# Patient Record
Sex: Male | Born: 1955 | Race: White | Hispanic: No | Smoking: Current some day smoker
Health system: Southern US, Community
[De-identification: ages and names within clinical notes are randomized; demographics above are authoritative.]

## PROBLEM LIST (undated history)

## (undated) DIAGNOSIS — K219 Gastro-esophageal reflux disease without esophagitis: Secondary | ICD-10-CM

## (undated) HISTORY — PX: ACHILLES TENDON REPAIR: SUR1153

---

## 2014-11-25 ENCOUNTER — Encounter: Payer: Self-pay | Admitting: Gastroenterology

## 2015-01-17 ENCOUNTER — Ambulatory Visit: Payer: Self-pay | Admitting: Gastroenterology

## 2015-01-18 ENCOUNTER — Encounter (HOSPITAL_COMMUNITY): Payer: Self-pay | Admitting: *Deleted

## 2015-01-18 ENCOUNTER — Emergency Department (HOSPITAL_COMMUNITY)
Admission: EM | Admit: 2015-01-18 | Discharge: 2015-01-18 | Disposition: A | Discharge status: Home / Self Care | Payer: BLUE CROSS/BLUE SHIELD | Attending: Emergency Medicine | Admitting: Emergency Medicine

## 2015-01-18 DIAGNOSIS — Z720 Tobacco use: Secondary | ICD-10-CM | POA: Diagnosis not present

## 2015-01-18 DIAGNOSIS — Z79899 Other long term (current) drug therapy: Secondary | ICD-10-CM | POA: Diagnosis not present

## 2015-01-18 DIAGNOSIS — K219 Gastro-esophageal reflux disease without esophagitis: Secondary | ICD-10-CM | POA: Diagnosis not present

## 2015-01-18 DIAGNOSIS — R112 Nausea with vomiting, unspecified: Secondary | ICD-10-CM | POA: Diagnosis not present

## 2015-01-18 DIAGNOSIS — R1011 Right upper quadrant pain: Secondary | ICD-10-CM | POA: Insufficient documentation

## 2015-01-18 HISTORY — DX: Gastro-esophageal reflux disease without esophagitis: K21.9

## 2015-01-18 LAB — URINALYSIS, ROUTINE W REFLEX MICROSCOPIC
BILIRUBIN URINE: NEGATIVE
Glucose, UA: NEGATIVE mg/dL
HGB URINE DIPSTICK: NEGATIVE
Ketones, ur: NEGATIVE mg/dL
Leukocytes, UA: NEGATIVE
Nitrite: NEGATIVE
PROTEIN: NEGATIVE mg/dL
SPECIFIC GRAVITY, URINE: 1.025 (ref 1.005–1.030)
UROBILINOGEN UA: 0.2 mg/dL (ref 0.0–1.0)
pH: 5.5 (ref 5.0–8.0)

## 2015-01-18 LAB — COMPREHENSIVE METABOLIC PANEL
ALBUMIN: 4.3 g/dL (ref 3.5–5.0)
ALK PHOS: 61 U/L (ref 38–126)
ALT: 31 U/L (ref 17–63)
AST: 27 U/L (ref 15–41)
Anion gap: 8 (ref 5–15)
BILIRUBIN TOTAL: 0.3 mg/dL (ref 0.3–1.2)
BUN: 21 mg/dL — AB (ref 6–20)
CALCIUM: 9.1 mg/dL (ref 8.9–10.3)
CO2: 27 mmol/L (ref 22–32)
CREATININE: 1.34 mg/dL — AB (ref 0.61–1.24)
Chloride: 102 mmol/L (ref 101–111)
GFR calc Af Amer: >60 mL/min (ref >60)
GFR calc non Af Amer: 57 mL/min — ABNORMAL LOW (ref >60)
GLUCOSE: 91 mg/dL (ref 65–99)
Potassium: 4 mmol/L (ref 3.5–5.1)
Sodium: 137 mmol/L (ref 135–145)
TOTAL PROTEIN: 7.4 g/dL (ref 6.5–8.1)

## 2015-01-18 LAB — CBC
HCT: 36.9 % — ABNORMAL LOW (ref 39.0–52.0)
Hemoglobin: 11.4 g/dL — ABNORMAL LOW (ref 13.0–17.0)
MCH: 27.1 pg (ref 26.0–34.0)
MCHC: 30.9 g/dL (ref 30.0–36.0)
MCV: 87.6 fL (ref 78.0–100.0)
Platelets: 241 10*3/uL (ref 150–400)
RBC: 4.21 MIL/uL — ABNORMAL LOW (ref 4.22–5.81)
RDW: 14 % (ref 11.5–15.5)
WBC: 6.3 10*3/uL (ref 4.0–10.5)

## 2015-01-18 LAB — LIPASE, BLOOD: Lipase: 30 U/L (ref 11–51)

## 2015-01-18 MED ORDER — OMEPRAZOLE 20 MG PO CPDR: 20.0000 mg | DELAYED_RELEASE_CAPSULE | Freq: Two times a day (BID) | ORAL | Status: AC

## 2015-01-18 MED ORDER — GI COCKTAIL ~~LOC~~
30.0000 mL | Freq: Once | ORAL | Status: AC
  Administered 2015-01-18: 30 mL via ORAL
  Filled 2015-01-18: qty 30

## 2015-01-18 NOTE — ED Notes (Signed)
Patient unable to void at this time

## 2015-01-18 NOTE — Discharge Instructions (Signed)

## 2015-01-18 NOTE — ED Notes (Addendum)
Patient reports right side abd pain that is sporadic in nature over the past year. Reports n/v at times, most recent vomiting was last week. Had a "severe" pain today while at work. Denies diarrhea. Patient reports after eating is when pain begins.

## 2015-01-18 NOTE — ED Provider Notes (Signed)
CSN: [id number]     Arrival date & time 01/18/15  1715 History   First MD Initiated Contact with Patient 01/18/15 1826     Chief Complaint  Patient presents with  . Abdominal Pain     (Consider location/radiation/quality/duration/timing/severity/associated sxs/prior Treatment) HPI  The patient is 59 years old, he has no prior surgical history, he does report a history of endoscopy in the past, notes that over the last year he has had intermittent right upper quadrant pain, seems to get worse after eating, this was particularly bad today but has since improved and is essentially resolved at this point. Occasionally he has nausea and vomiting, there is no diarrhea, no changes in stools, no rectal bleeding, no fevers or chills. Symptoms are intermittent, currently they are scant and almost completely resolved.  Past Medical History  Diagnosis Date  . Acid reflux    Past Surgical History  Procedure Laterality Date  . Achilles tendon repair     History reviewed. No pertinent family history. Social History  Substance Use Topics  . Smoking status: Current Some Day Smoker    Types: Cigarettes  . Smokeless tobacco: None  . Alcohol Use: No     Comment: occ    Review of Systems  All other systems reviewed and are negative.     Allergies  Aspirin  Home Medications   Prior to Admission medications   Medication Sig Start Date End Date Taking? Authorizing Provider  Cholecalciferol (VITAMIN D3) 10000 UNITS TABS Take 1,000 Units by mouth daily.   Yes Historical Provider, MD  vitamin B-12 (CYANOCOBALAMIN) 1000 MCG tablet Take 1,000 mcg by mouth daily.   Yes Historical Provider, MD  omeprazole (PRILOSEC) 20 MG capsule Take 1 capsule (20 mg total) by mouth 2 (two) times daily before a meal. 01/18/15   Eber Hong, MD   BP 114/64 mmHg  Pulse 84  Temp(Src) 98.1 F (36.7 C) (Oral)  Resp 18  Ht 6' (1.829 m)  Wt 200 lb (90.719 kg)  BMI 27.12 kg/m2  SpO2 100% Physical Exam   Constitutional: He appears well-developed and well-nourished. No distress.  HENT:  Head: Normocephalic and atraumatic.  Mouth/Throat: Oropharynx is clear and moist. No oropharyngeal exudate.  Eyes: Conjunctivae and EOM are normal. Pupils are equal, round, and reactive to light. Right eye exhibits no discharge. Left eye exhibits no discharge. No scleral icterus.  Neck: Normal range of motion. Neck supple. No JVD present. No thyromegaly present.  Cardiovascular: Normal rate, regular rhythm, normal heart sounds and intact distal pulses.  Exam reveals no gallop and no friction rub.   No murmur heard. Pulmonary/Chest: Effort normal and breath sounds normal. No respiratory distress. He has no wheezes. He has no rales.  Abdominal: Soft. Bowel sounds are normal. He exhibits no distension and no mass. There is tenderness ( scant ttp in the RUQ - no guarding.).  Musculoskeletal: Normal range of motion. He exhibits no edema or tenderness.  Lymphadenopathy:    He has no cervical adenopathy.  Neurological: He is alert. Coordination normal.  Skin: Skin is warm and dry. No rash noted. No erythema.  Psychiatric: He has a normal mood and affect. His behavior is normal.  Nursing note and vitals reviewed.   ED Course  Procedures (including critical care time) Labs Review Labs Reviewed  COMPREHENSIVE METABOLIC PANEL - Abnormal; Notable for the following:    BUN 21 (*)    Creatinine, Ser 1.34 (*)    GFR calc non Af Amer 57 (*)  All other components within normal limits  CBC - Abnormal; Notable for the following:    RBC 4.21 (*)    Hemoglobin 11.4 (*)    HCT 36.9 (*)    All other components within normal limits  LIPASE, BLOOD  URINALYSIS, ROUTINE W REFLEX MICROSCOPIC ([facility])    Imaging Review No results found. I have personally reviewed and evaluated these images and lab results as part of my medical decision-making.    MDM   Final diagnoses:  Right upper quadrant pain     Possible GB disease vs PUD vs gastritis vs pancreatitis - No jaundice, VS normal - essentially resolved pain by arrival - check labs - anticipate US in the morning if there is no other findings to suggest need for emergent CT scan.  No pain at mcB point to suggest appy.  Improved with meds - labs reassuring - pt updated - given gen surg referral and will come back in AM for US - prilosec.  Eber HongBrian Clenton Esper, MD 01/18/15 2045

## 2015-01-19 ENCOUNTER — Ambulatory Visit (HOSPITAL_COMMUNITY)
Admission: RE | Admit: 2015-01-19 | Discharge: 2015-01-19 | Disposition: A | Discharge status: Home / Self Care | Payer: BLUE CROSS/BLUE SHIELD | Source: Ambulatory Visit | Attending: Emergency Medicine | Admitting: Emergency Medicine

## 2015-01-19 ENCOUNTER — Other Ambulatory Visit (HOSPITAL_COMMUNITY): Payer: Self-pay | Admitting: Emergency Medicine

## 2015-01-19 DIAGNOSIS — R1084 Generalized abdominal pain: Secondary | ICD-10-CM

## 2015-01-19 DIAGNOSIS — R109 Unspecified abdominal pain: Secondary | ICD-10-CM | POA: Diagnosis not present

## 2015-03-09 ENCOUNTER — Ambulatory Visit: Payer: Self-pay | Admitting: Gastroenterology

## 2016-12-17 IMAGING — US US ABDOMEN LIMITED
1 series · 14 of 25 positions shown · non-contrast
Comparison: None.

CLINICAL DATA: Postprandial abdominal pain over the last year,
worse today

EXAM:
US ABDOMEN LIMITED - RIGHT UPPER QUADRANT

[Series 1: us abdomen limited · 0.21mm/px · 14 of 54 slices shown]
[im 1/54]
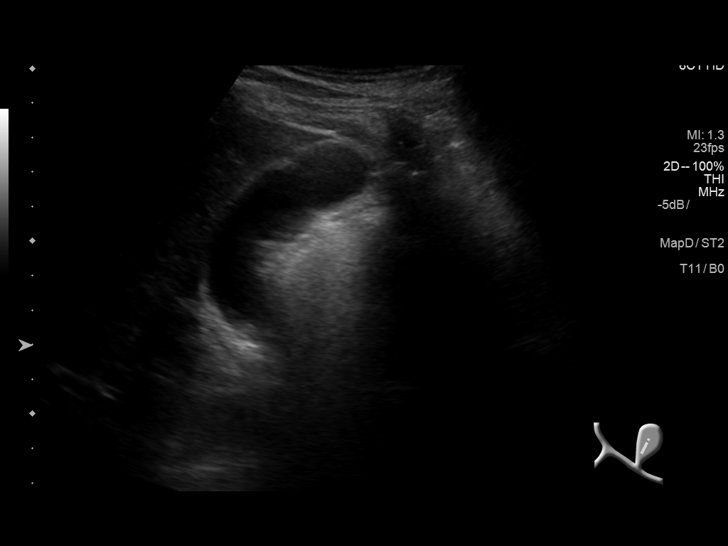
[im 5/54]
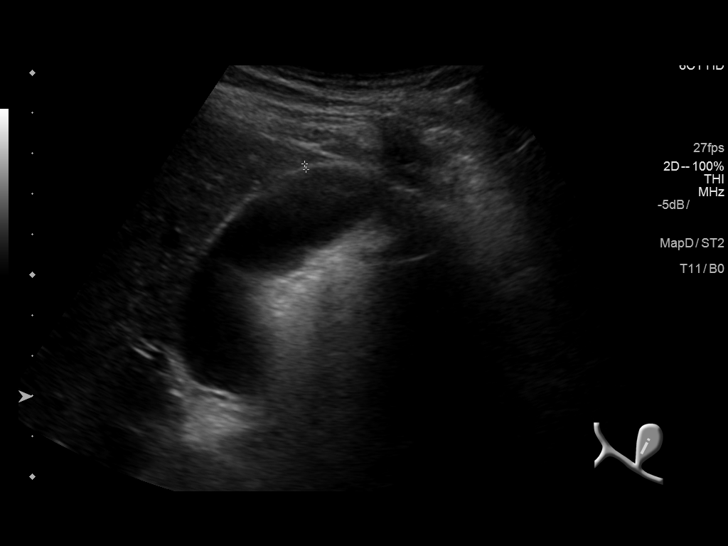
[im 9/54]
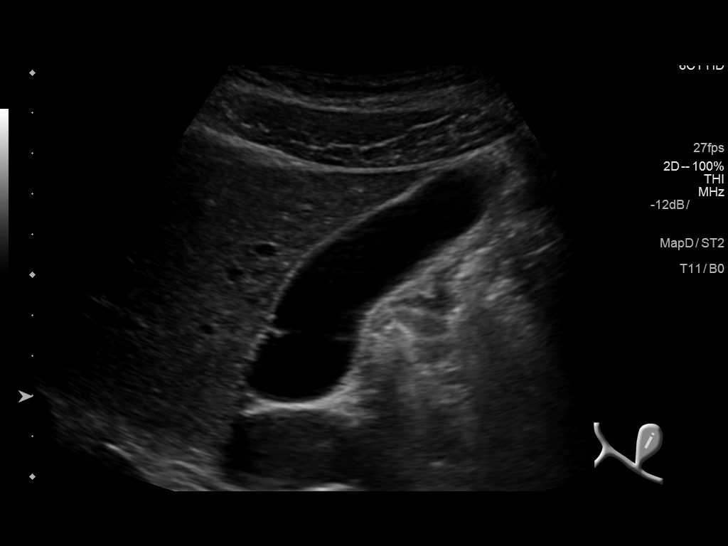
[im 14/54]
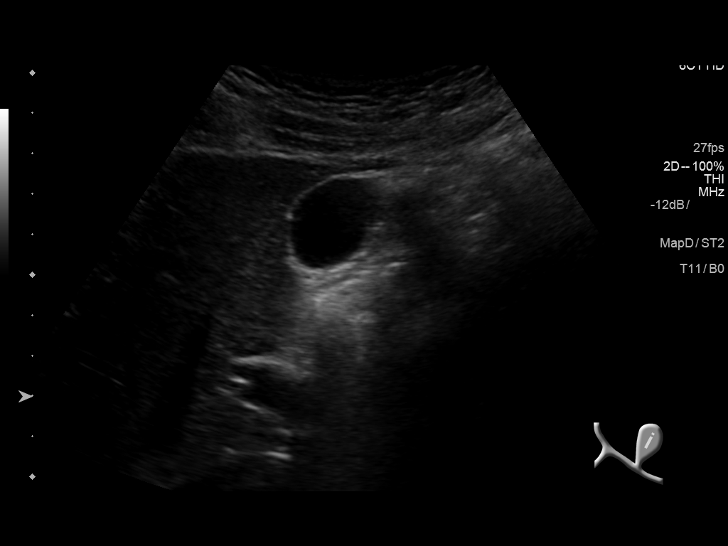
[im 18/54]
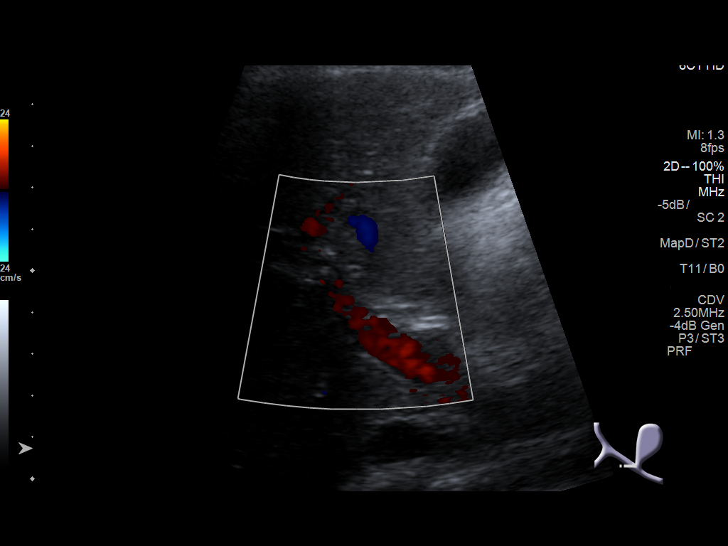
[im 20/54]
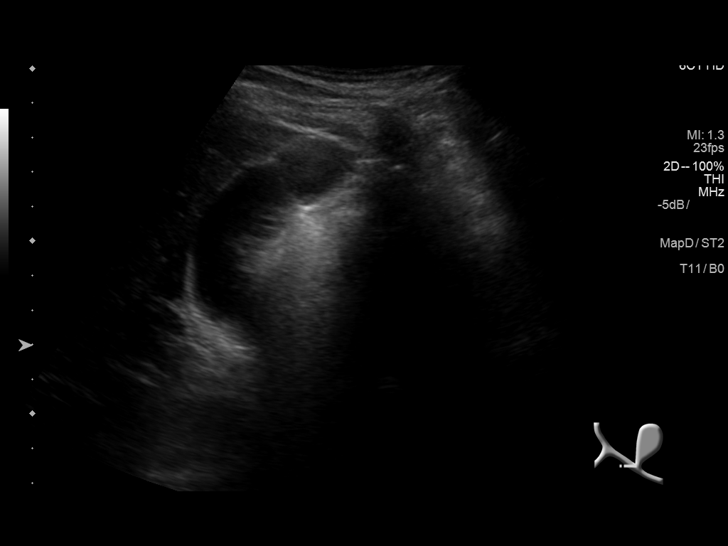
[im 25/54]
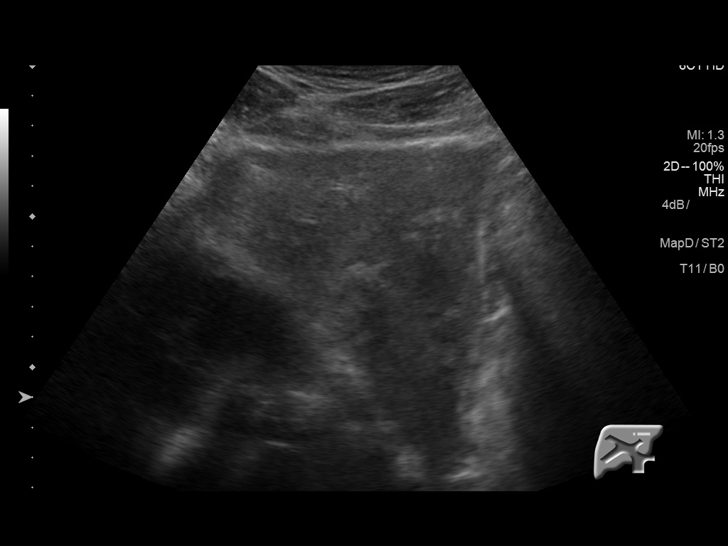
[im 29/54]
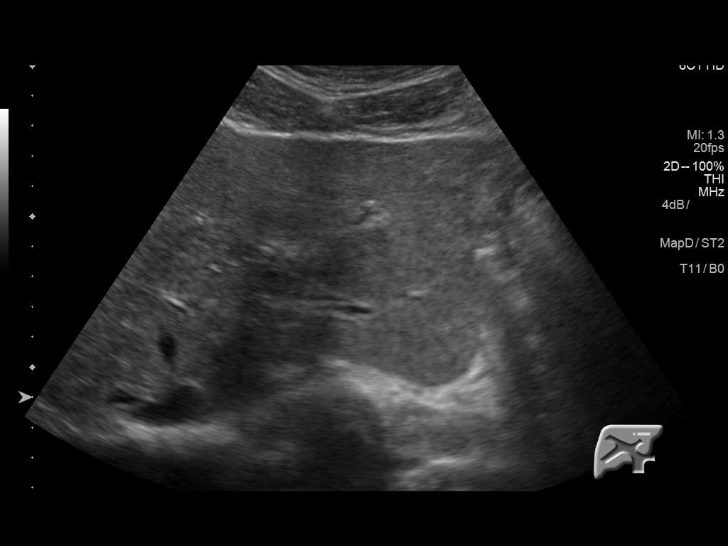
[im 34/54]
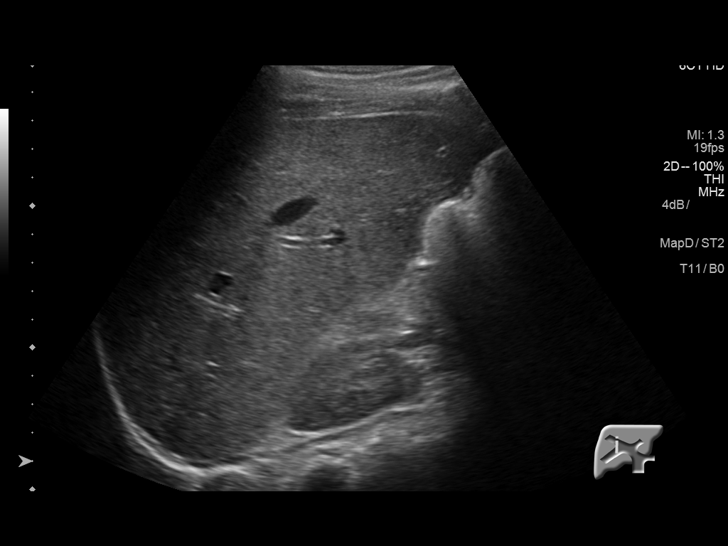
[im 36/54]
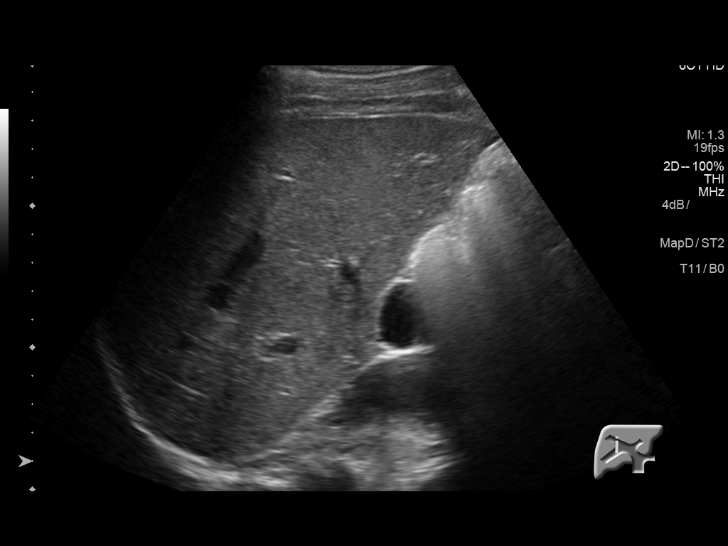
[im 40/54]
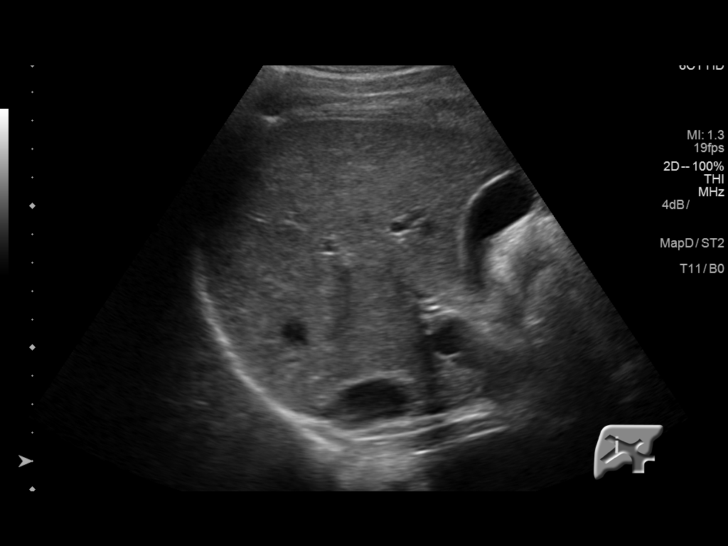
[im 45/54]
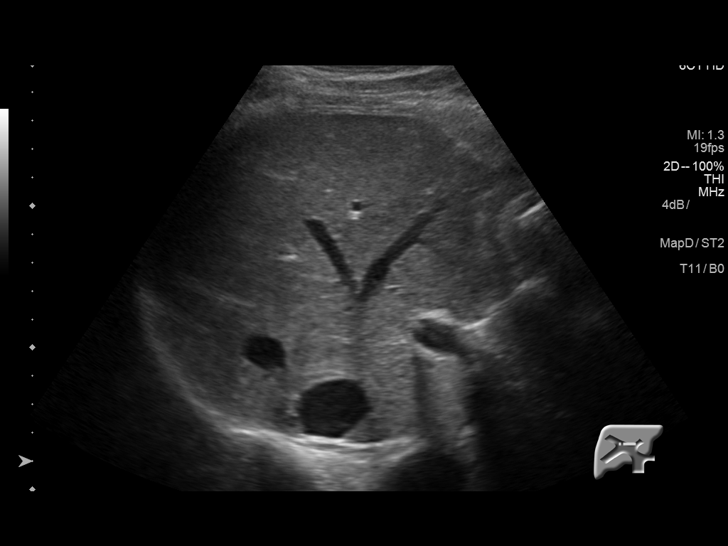
[im 49/54]
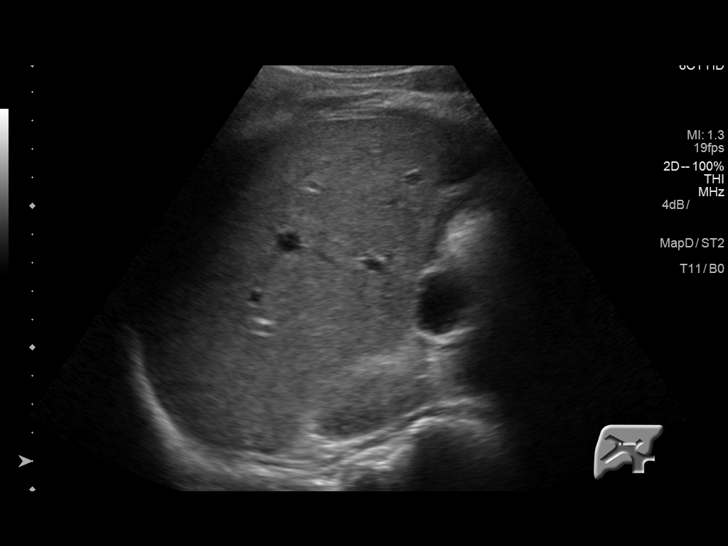
[im 54/54]
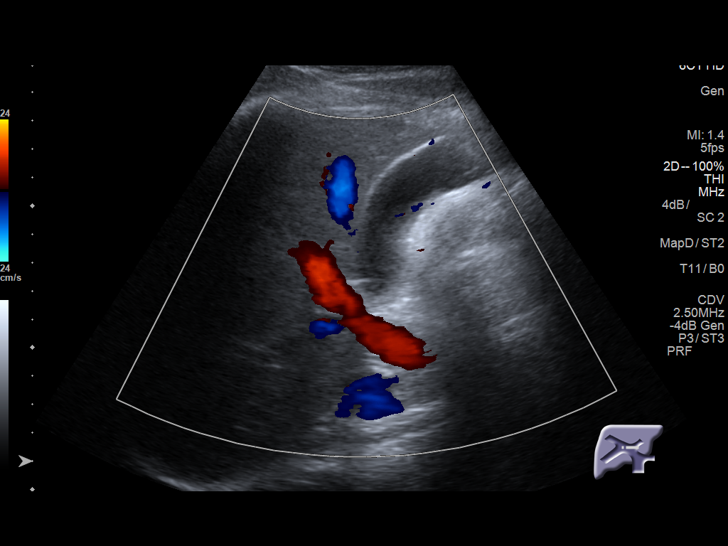

[14 of 25 positions shown; findings below may reference images not displayed]

FINDINGS: Gallbladder:

The gallbladder is visualized and no gallstones are noted. There is
no pain over the gallbladder with compression.

Common bile duct:

Diameter: The common bile duct is normal measuring 2.4 mm.

Liver:

The liver has a normal echogenic pattern. No focal hepatic
abnormality is seen.
IMPRESSION: Negative limited ultrasound of the right upper quadrant.

## 2018-10-29 ENCOUNTER — Other Ambulatory Visit: Payer: Self-pay | Admitting: Neurological Surgery

## 2018-10-29 DIAGNOSIS — M7070 Other bursitis of hip, unspecified hip: Secondary | ICD-10-CM

## 2018-11-11 ENCOUNTER — Other Ambulatory Visit: Payer: BLUE CROSS/BLUE SHIELD

## 2018-11-18 ENCOUNTER — Other Ambulatory Visit: Payer: Self-pay

## 2018-11-18 ENCOUNTER — Ambulatory Visit
Admission: RE | Admit: 2018-11-18 | Discharge: 2018-11-18 | Disposition: A | Discharge status: Home / Self Care | Payer: BC Managed Care – PPO | Source: Ambulatory Visit | Attending: Neurological Surgery | Admitting: Neurological Surgery

## 2018-11-18 DIAGNOSIS — M7070 Other bursitis of hip, unspecified hip: Secondary | ICD-10-CM

## 2018-11-18 MED ORDER — METHYLPREDNISOLONE ACETATE 40 MG/ML INJ SUSP (RADIOLOG
120.0000 mg | Freq: Once | INTRAMUSCULAR | Status: AC
  Administered 2018-11-18: 120 mg via INTRAMUSCULAR

## 2018-11-18 MED ORDER — IOPAMIDOL (ISOVUE-M 200) INJECTION 41%
1.0000 mL | Freq: Once | INTRAMUSCULAR | Status: AC
  Administered 2018-11-18: 1 mL

## 2020-04-16 IMAGING — XA DG FLUORO GUIDE NDL PLC/BX
1 series · 1 of 1 positions shown · non-contrast
Comparison: none

CLINICAL DATA: Left hip bursitis.  Low back and left hip pain.

[Series 1: ortho adipose · 1 of 1 slices shown]
[im 1/1]
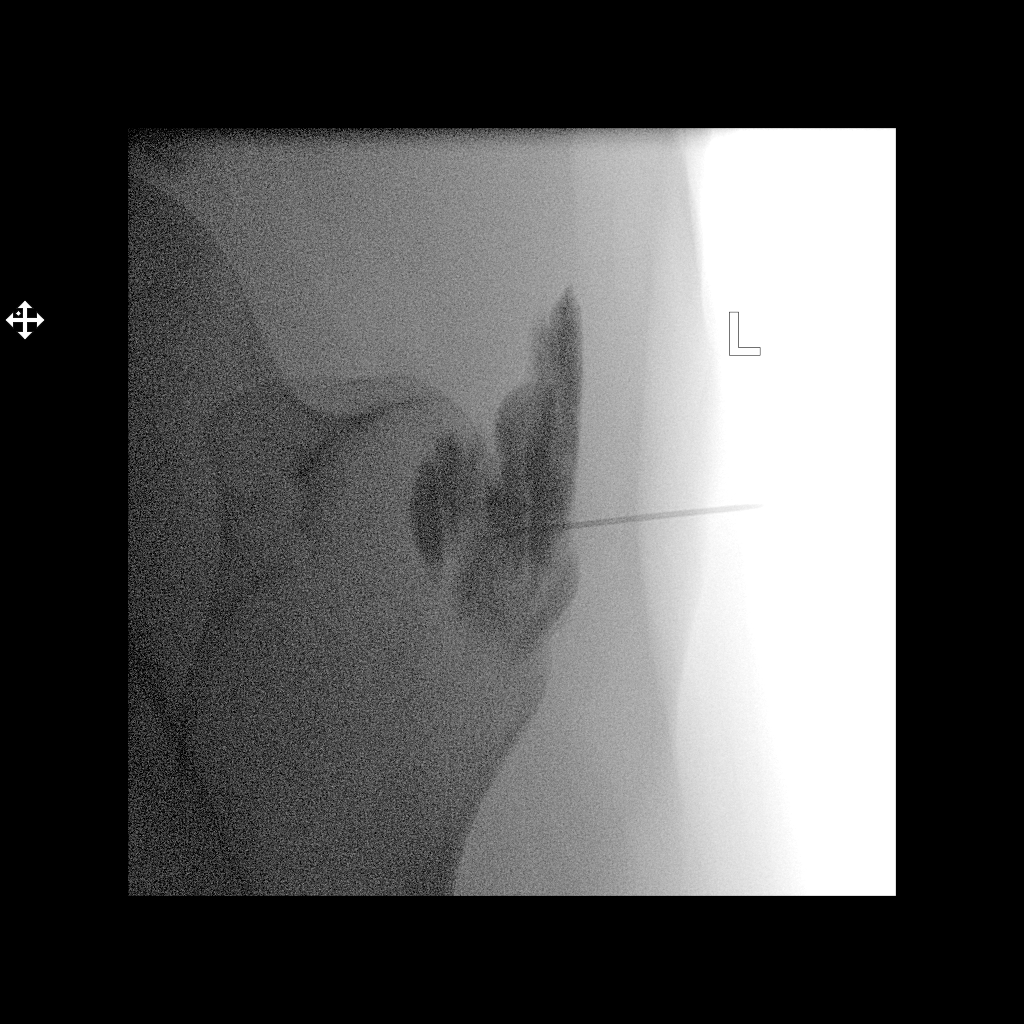

[1 of 1 positions shown; findings below may reference images not displayed]

EXAM:
LEFT TROCHANTERIC BURSA INJECTION UNDER FLUOROSCOPY

FLUOROSCOPY TIME:  Fluoroscopy Time:  6 seconds

Radiation Exposure Index (if provided by the fluoroscopic device):
17.86

Number of Acquired Spot Images: 0

PROCEDURE:
Overlying skin prepped with Betadine, draped in the usual sterile
fashion, and infiltrated locally with 1% lidocaine. A 1.5 inch 21
gauge needle was advanced onto the lateral aspect of the left
greater trochanter under fluoroscopy. Injection of a small amount of
Isovue-M 200 initially demonstrated spread superior and lateral
along a tendon. The needle position was adjusted to obtain contrast
spread along the greater trochanter in the expected region of the
bursa. 120 mg of Depo-Medrol and 3 mL of 0.5% bupivacaine were then
administered. No immediate complication.
IMPRESSION: Technically successful left greater trochanteric bursa injection
under fluoroscopy.
